# Patient Record
Sex: Male | Born: 1985 | Race: Black or African American | Hispanic: No | Marital: Married | Smoking: Current every day smoker
Health system: Southern US, Community
[De-identification: ages and names within clinical notes are randomized; demographics above are authoritative.]

## PROBLEM LIST (undated history)

## (undated) DIAGNOSIS — F319 Bipolar disorder, unspecified: Secondary | ICD-10-CM

## (undated) DIAGNOSIS — F209 Schizophrenia, unspecified: Secondary | ICD-10-CM

## (undated) DIAGNOSIS — J45909 Unspecified asthma, uncomplicated: Secondary | ICD-10-CM

## (undated) HISTORY — PX: HIP SURGERY: SHX245

---

## 2020-03-13 ENCOUNTER — Encounter (HOSPITAL_COMMUNITY): Payer: Self-pay

## 2020-03-13 ENCOUNTER — Other Ambulatory Visit: Payer: Self-pay

## 2020-03-13 DIAGNOSIS — Z20822 Contact with and (suspected) exposure to covid-19: Secondary | ICD-10-CM | POA: Insufficient documentation

## 2020-03-13 DIAGNOSIS — R42 Dizziness and giddiness: Secondary | ICD-10-CM | POA: Diagnosis not present

## 2020-03-13 DIAGNOSIS — R110 Nausea: Secondary | ICD-10-CM | POA: Diagnosis not present

## 2020-03-13 NOTE — ED Triage Notes (Signed)
Pt sts nausea and lightheaded x 2 days.

## 2020-03-14 ENCOUNTER — Emergency Department (HOSPITAL_COMMUNITY)
Admission: EM | Admit: 2020-03-14 | Discharge: 2020-03-14 | Disposition: A | Discharge status: Home / Self Care | Payer: Medicare (Managed Care) | Attending: Emergency Medicine | Admitting: Emergency Medicine

## 2020-03-14 DIAGNOSIS — R110 Nausea: Secondary | ICD-10-CM | POA: Diagnosis not present

## 2020-03-14 LAB — SARS CORONAVIRUS 2 (TAT 6-24 HRS): SARS Coronavirus 2: NEGATIVE

## 2020-03-14 MED ORDER — ONDANSETRON 4 MG PO TBDP: 4.0000 mg | ORAL_TABLET | Freq: Three times a day (TID) | ORAL | 0 refills | Status: AC | PRN

## 2020-03-14 MED ORDER — ONDANSETRON HCL 4 MG/2ML IJ SOLN
4.0000 mg | Freq: Once | INTRAMUSCULAR | Status: AC
  Administered 2020-03-14: 4 mg via INTRAVENOUS
  Filled 2020-03-14: qty 2

## 2020-03-14 NOTE — Discharge Instructions (Signed)
You were seen today for nausea and lightheadedness.  Given the chills, you were tested for COVID-19.  Check MyChart in 12 to 24 hours for results.  That testing is pending at this time.  Take Zofran as needed for nausea.  Make sure that you are staying hydrated.

## 2020-03-14 NOTE — ED Provider Notes (Signed)
[facility]-EMERGENCY DEPT Provider Note   CSN: [id number] Arrival date & time: 03/13/20  2116     History Chief Complaint  Patient presents with  . Nausea    Raymond Watkins is a 35 y.o. male.  HPI     This is a 35 year old male with no reported past medical history who presents with 1 to 2-day history of lightheadedness and nausea.  He states that generally he has felt unwell and has had chills.  He reports lightheadedness not described as room spinning.  He has associated nausea.  No vomiting or diarrhea.  No abdominal pain, chest pain, shortness of breath.  No known sick contacts or Covid exposures.  He did not take anything for his symptoms.  He states that he was at work prior to presenting to the ED.  Denies any recent changes in medicines.  Denies any alcohol or drug use.  History reviewed. No pertinent past medical history.  There are no problems to display for this patient.   History reviewed. No pertinent surgical history.     No family history on file.  Social History   Tobacco Use  . Smoking status: Never Smoker  . Smokeless tobacco: Never Used  Substance Use Topics  . Alcohol use: Never  . Drug use: Never    Home Medications Prior to Admission medications   Medication Sig Start Date End Date Taking? Authorizing Provider  ondansetron (ZOFRAN ODT) 4 MG disintegrating tablet Take 1 tablet (4 mg total) by mouth every 8 (eight) hours as needed for nausea or vomiting. 03/14/20  Yes Danyal Adorno, Mayer Masker, MD    Allergies    Patient has no known allergies.  Review of Systems   Review of Systems  Constitutional: Negative for fever.  Respiratory: Negative for shortness of breath.   Cardiovascular: Negative for chest pain.  Gastrointestinal: Positive for nausea. Negative for abdominal pain, diarrhea and vomiting.  Genitourinary: Negative for dysuria.  Neurological: Positive for light-headedness. Negative for dizziness, speech  difficulty, weakness and headaches.  All other systems reviewed and are negative.   Physical Exam Updated Vital Signs BP 124/86 (BP Location: Right Arm)   Pulse 73   Temp 98.4 F (36.9 C) (Oral)   Resp 14   Ht 1.956 m (6\' 5" )   Wt 79.4 kg   SpO2 97%   BMI 20.75 kg/m   Physical Exam Vitals and nursing note reviewed.  Constitutional:      Appearance: He is well-developed and well-nourished. He is not ill-appearing.  HENT:     Head: Normocephalic and atraumatic.     Nose: Nose normal.     Mouth/Throat:     Mouth: Mucous membranes are moist.  Eyes:     Pupils: Pupils are equal, round, and reactive to light.  Cardiovascular:     Rate and Rhythm: Normal rate and regular rhythm.     Heart sounds: Normal heart sounds. No murmur heard.   Pulmonary:     Effort: Pulmonary effort is normal. No respiratory distress.     Breath sounds: Normal breath sounds. No wheezing.  Abdominal:     General: Bowel sounds are normal.     Palpations: Abdomen is soft.     Tenderness: There is no abdominal tenderness. There is no rebound.  Musculoskeletal:        General: No edema.     Cervical back: Neck supple.     Right lower leg: No edema.     Left lower leg:  No edema.  Lymphadenopathy:     Cervical: No cervical adenopathy.  Skin:    General: Skin is warm and dry.  Neurological:     Mental Status: He is alert and oriented to person, place, and time.     Comments: Cranial nerves II through XII intact, 5 out of 5 strength in all 4 extremities, no dysmetria to finger-nose-finger  Psychiatric:        Mood and Affect: Mood and affect and mood normal.     ED Results / Procedures / Treatments   Labs (all labs ordered are listed, but only abnormal results are displayed) Labs Reviewed  SARS CORONAVIRUS 2 (TAT 6-24 HRS)    EKG EKG Interpretation  Date/Time:  Tuesday March 14 2020 02:48:37 EST Ventricular Rate:  62 PR Interval:    QRS Duration: 95 QT Interval:  403 QTC  Calculation: 410 R Axis:   54 Text Interpretation: Sinus arrhythmia Probable left ventricular hypertrophy Confirmed by Ross Marcus ([id number]) on 03/14/2020 3:25:49 AM   Radiology No results found.  Procedures Procedures   Medications Ordered in ED Medications  ondansetron (ZOFRAN) injection 4 mg (4 mg Intravenous Given 03/14/20 0301)    ED Course  I have reviewed the triage vital signs and the nursing notes.  Pertinent labs & imaging results that were available during my care of the patient were reviewed by me and considered in my medical decision making (see chart for details).    MDM Rules/Calculators/A&P                          Patient presents with nausea lightheadedness.  Reports chills.  He is overall nontoxic and vital signs are reassuring.  He is afebrile.  He has fairly nonspecific symptoms and is well-appearing on exam.  No chest pain, shortness of breath, abdominal pain.  Exam is benign.  Doubt intra-abdominal pathology, ACS, serious bacterial infection.  He is fully vaccinated against COVID-19.  Patient was given Zofran for his nausea.  Orthostatics are negative.  EKG shows sinus arrhythmia but no malignant arrhythmia or evidence of ischemia.  Patient was tested for COVID-19 given prevalence in the community and chills reported.  On recheck after Zofran, patient is tolerating fluids and states he feels better.  Given that he is otherwise healthy, do not feel he needs further work-up.  Recommended hydration and Zofran as needed for nausea.  After history, exam, and medical workup I feel the patient has been appropriately medically screened and is safe for discharge home. Pertinent diagnoses were discussed with the patient. Patient was given return precautions.  Raymond Watkins was evaluated in Emergency Department on 03/14/2020 for the symptoms described in the history of present illness. He was evaluated in the context of the global COVID-19 pandemic, which necessitated  consideration that the patient might be at risk for infection with the SARS-CoV-2 virus that causes COVID-19. Institutional protocols and algorithms that pertain to the evaluation of patients at risk for COVID-19 are in a state of rapid change based on information released by regulatory bodies including the CDC and federal and state organizations. These policies and algorithms were followed during the patient's care in the ED.  Final Clinical Impression(s) / ED Diagnoses Final diagnoses:  Nausea    Rx / DC Orders ED Discharge Orders         Ordered    ondansetron (ZOFRAN ODT) 4 MG disintegrating tablet  Every 8 hours PRN  03/14/20 0406           Shon Baton, MD 03/14/20 [phone]

## 2020-05-13 ENCOUNTER — Encounter (HOSPITAL_BASED_OUTPATIENT_CLINIC_OR_DEPARTMENT_OTHER): Payer: Self-pay | Admitting: *Deleted

## 2020-05-13 ENCOUNTER — Other Ambulatory Visit: Payer: Self-pay

## 2020-05-13 ENCOUNTER — Emergency Department (HOSPITAL_BASED_OUTPATIENT_CLINIC_OR_DEPARTMENT_OTHER)
Admission: EM | Admit: 2020-05-13 | Discharge: 2020-05-13 | Disposition: A | Discharge status: Home / Self Care | Payer: Medicare (Managed Care) | Attending: Emergency Medicine | Admitting: Emergency Medicine

## 2020-05-13 ENCOUNTER — Emergency Department (HOSPITAL_BASED_OUTPATIENT_CLINIC_OR_DEPARTMENT_OTHER): Payer: Medicare (Managed Care)

## 2020-05-13 DIAGNOSIS — J45909 Unspecified asthma, uncomplicated: Secondary | ICD-10-CM | POA: Insufficient documentation

## 2020-05-13 DIAGNOSIS — M25511 Pain in right shoulder: Secondary | ICD-10-CM | POA: Insufficient documentation

## 2020-05-13 DIAGNOSIS — R0781 Pleurodynia: Secondary | ICD-10-CM | POA: Insufficient documentation

## 2020-05-13 DIAGNOSIS — F17210 Nicotine dependence, cigarettes, uncomplicated: Secondary | ICD-10-CM | POA: Insufficient documentation

## 2020-05-13 HISTORY — DX: Unspecified asthma, uncomplicated: J45.909

## 2020-05-13 LAB — CBC WITH DIFFERENTIAL/PLATELET
Abs Immature Granulocytes: 0.04 10*3/uL (ref 0.00–0.07)
Basophils Absolute: 0 10*3/uL (ref 0.0–0.1)
Basophils Relative: 1 %
Eosinophils Absolute: 0 10*3/uL (ref 0.0–0.5)
Eosinophils Relative: 1 %
HCT: 40.1 % (ref 39.0–52.0)
Hemoglobin: 13.5 g/dL (ref 13.0–17.0)
Immature Granulocytes: 1 %
Lymphocytes Relative: 32 %
Lymphs Abs: 2.5 10*3/uL (ref 0.7–4.0)
MCH: 29.4 pg (ref 26.0–34.0)
MCHC: 33.7 g/dL (ref 30.0–36.0)
MCV: 87.4 fL (ref 80.0–100.0)
Monocytes Absolute: 0.4 10*3/uL (ref 0.1–1.0)
Monocytes Relative: 5 %
Neutro Abs: 4.8 10*3/uL (ref 1.7–7.7)
Neutrophils Relative %: 60 %
Platelets: 206 10*3/uL (ref 150–400)
RBC: 4.59 MIL/uL (ref 4.22–5.81)
RDW: 12.7 % (ref 11.5–15.5)
WBC: 7.8 10*3/uL (ref 4.0–10.5)
nRBC: 0 % (ref 0.0–0.2)

## 2020-05-13 LAB — URINALYSIS, ROUTINE W REFLEX MICROSCOPIC
Bilirubin Urine: NEGATIVE
Glucose, UA: NEGATIVE mg/dL
Hgb urine dipstick: NEGATIVE
Ketones, ur: NEGATIVE mg/dL
Nitrite: NEGATIVE
Protein, ur: NEGATIVE mg/dL
Specific Gravity, Urine: 1.025 (ref 1.005–1.030)
pH: 5.5 (ref 5.0–8.0)

## 2020-05-13 LAB — BASIC METABOLIC PANEL
Anion gap: 11 (ref 5–15)
BUN: 16 mg/dL (ref 6–20)
CO2: 24 mmol/L (ref 22–32)
Calcium: 8.6 mg/dL — ABNORMAL LOW (ref 8.9–10.3)
Chloride: 104 mmol/L (ref 98–111)
Creatinine, Ser: 1.04 mg/dL (ref 0.61–1.24)
GFR, Estimated: >60 mL/min (ref >60)
Glucose, Bld: 86 mg/dL (ref 70–99)
Potassium: 3.7 mmol/L (ref 3.5–5.1)
Sodium: 139 mmol/L (ref 135–145)

## 2020-05-13 LAB — URINALYSIS, MICROSCOPIC (REFLEX)

## 2020-05-13 MED ORDER — IBUPROFEN 800 MG PO TABS
800.0000 mg | ORAL_TABLET | Freq: Once | ORAL | Status: AC
  Administered 2020-05-13: 800 mg via ORAL
  Filled 2020-05-13 (×2): qty 1

## 2020-05-13 NOTE — ED Triage Notes (Signed)
Pt reports shooting pain to right ribs wrapping around to back. States he woke with the pain. Denies falls/injury

## 2020-05-13 NOTE — Discharge Instructions (Signed)
You were seen in the ER today for your right-sided rib pain and shoulder pain.  Your physical exam, x-rays, and blood work were very reassuring.  While the exact cause of your symptoms remains unclear there is not any emergent problem at this time.  You are likely experiencing inflammation of the muscles and joint of your shoulder because of sleeping with your arm raised above your head.  Please utilize ice to the area of the ribs that is more sore, and take medications as Tylenol or ibuprofen as needed at home for pain return to the ER if you develop any worsening chest pain, fevers, chills, cough, shortness of breath, palpitations or any other new severe symptoms.

## 2020-05-13 NOTE — ED Provider Notes (Signed)
[facility] EMERGENCY DEPARTMENT Provider Note   CSN: [id number] Arrival date & time: 05/13/20  1323     History Chief Complaint  Patient presents with  . Flank Pain  . Shoulder Pain    Raymond Watkins is a 35 y.o. male who presents with right-sided rib pain and shoulder pain that he woke up with this morning.  Patient states he sleeps with his arm over his head behind his head much of the time, denies any recent trauma or new activities.  He states that he was playing with his children yesterday evening and feeling well when he went to bed but woke up this morning with tenderness palpation over the back right ribs and in his shoulder as well.  He denies any fevers or chills at home, denies any chest pain, cough, shortness of breath, or palpitations.  He does not have sickle cell disease.  Direct pressure and movement make the pain worse, but the pain is relieved with Goody powders or Tylenol.  I personally reviewed this patient's medical records.  He has history of asthma, hip surgery, and remote history of polysubstance abuse.  He is not on any medications every day.  HPI     Past Medical History:  Diagnosis Date  . Asthma     There are no problems to display for this patient.   Past Surgical History:  Procedure Laterality Date  . HIP SURGERY         No family history on file.  Social History   Tobacco Use  . Smoking status: Current Every Day Smoker    Types: Cigarettes  . Smokeless tobacco: Never Used  Substance Use Topics  . Alcohol use: Yes  . Drug use: Never    Home Medications Prior to Admission medications   Medication Sig Start Date End Date Taking? Authorizing Provider  ondansetron (ZOFRAN ODT) 4 MG disintegrating tablet Take 1 tablet (4 mg total) by mouth every 8 (eight) hours as needed for nausea or vomiting. 03/14/20   Horton, Mayer Masker, MD    Allergies    Patient has no known allergies.  Review of Systems   Review of Systems   Constitutional: Negative for activity change, appetite change, chills, diaphoresis, fatigue and fever.  HENT: Negative.   Respiratory: Negative.  Negative for cough, shortness of breath and wheezing.   Cardiovascular: Negative for chest pain, palpitations and leg swelling.  Gastrointestinal: Negative.   Musculoskeletal: Positive for arthralgias and myalgias.       Posterior right rib pain, right shoulder pain    Physical Exam Updated Vital Signs BP (!) 229/76 (BP Location: Left Arm)   Pulse 86   Temp 98.3 F (36.8 C) (Oral)   Resp 18   Ht 6\' 5"  (1.956 m)   Wt 81.6 kg   SpO2 99%   BMI 21.34 kg/m   Physical Exam Vitals and nursing note reviewed.  HENT:     Head: Normocephalic and atraumatic.     Nose: Nose normal.     Mouth/Throat:     Mouth: Mucous membranes are moist.     Pharynx: Oropharynx is clear. Uvula midline. No oropharyngeal exudate or posterior oropharyngeal erythema.  Eyes:     General: Lids are normal. Vision grossly intact.        Right eye: No discharge.        Left eye: No discharge.     Extraocular Movements: Extraocular movements intact.     Conjunctiva/sclera: Conjunctivae normal.  Pupils: Pupils are equal, round, and reactive to light.  Neck:     Trachea: Trachea and phonation normal.  Cardiovascular:     Rate and Rhythm: Normal rate and regular rhythm.     Pulses: Normal pulses.     Heart sounds: Normal heart sounds. No murmur heard.   Pulmonary:     Effort: Pulmonary effort is normal. No tachypnea, bradypnea, accessory muscle usage, prolonged expiration or respiratory distress.     Breath sounds: Normal breath sounds. No wheezing or rales.    Chest:     Chest wall: Tenderness present. No mass, lacerations, deformity, swelling, crepitus or edema. There is no dullness to percussion.     Comments: No anterior chest wall tenderness to palpation, crepitus, or deformity.  Abdominal:     General: Bowel sounds are normal. There is no distension.      Tenderness: There is no abdominal tenderness. There is no right CVA tenderness, left CVA tenderness, guarding or rebound.  Musculoskeletal:        General: No deformity.     Right shoulder: Tenderness and bony tenderness present. No swelling, deformity, effusion, laceration or crepitus. Normal range of motion. Normal strength.     Left shoulder: Normal.     Right upper arm: Normal.     Left upper arm: Normal.     Right elbow: Normal.     Left elbow: Normal.     Right forearm: Normal.     Left forearm: Normal.     Right wrist: Normal.     Left wrist: Normal.     Right hand: Normal.     Left hand: Normal.       Arms:     Cervical back: Neck supple. No crepitus. No pain with movement, spinous process tenderness or muscular tenderness.     Right lower leg: No edema.     Left lower leg: No edema.  Lymphadenopathy:     Cervical: No cervical adenopathy.  Skin:    General: Skin is warm and dry.  Neurological:     General: No focal deficit present.     Mental Status: He is alert. Mental status is at baseline.  Psychiatric:        Mood and Affect: Mood normal.     ED Results / Procedures / Treatments   Labs (all labs ordered are listed, but only abnormal results are displayed) Labs Reviewed  BASIC METABOLIC PANEL - Abnormal; Notable for the following components:      Result Value   Calcium 8.6 (*)    All other components within normal limits  URINALYSIS, ROUTINE W REFLEX MICROSCOPIC - Abnormal; Notable for the following components:   Leukocytes,Ua SMALL (*)    All other components within normal limits  URINALYSIS, MICROSCOPIC (REFLEX) - Abnormal; Notable for the following components:   Bacteria, UA FEW (*)    All other components within normal limits  CBC WITH DIFFERENTIAL/PLATELET    EKG None  Radiology DG Ribs Unilateral W/Chest Right  Result Date: 05/13/2020 CLINICAL DATA:  Right rib pain. EXAM: RIGHT RIBS AND CHEST - 3+ VIEW COMPARISON:  Right shoulder  radiographs-earlier same day FINDINGS: Normal cardiac silhouette and mediastinal contours. No focal parenchymal opacities. No pleural effusion or pneumothorax. No evidence of edema. No definite displaced right-sided rib fractures with special attention paid to the area demarcated by the radiopaque BB. No radiopaque foreign body. IMPRESSION: 1. No acute cardiopulmonary disease. 2. No definite displaced right-sided rib fractures with special attention paid  to the area demarcated by the radiopaque BB. Electronically Signed   By: Simonne Come M.D.   On: 05/13/2020 15:13   DG Shoulder Right  Result Date: 05/13/2020 CLINICAL DATA:  Shooting pain involving the right ribs and shoulder. Evaluate for fracture EXAM: RIGHT SHOULDER - 2+ VIEW COMPARISON:  Right rib radiographic series-earlier same day FINDINGS: No fracture or dislocation. Joint spaces are preserved. No evidence of calcific tendinitis. Limited visualization of the adjacent thorax is normal. Regional soft tissues appear normal. IMPRESSION: No explanation for patient's right shoulder pain. Electronically Signed   By: Simonne Come M.D.   On: 05/13/2020 15:12    Procedures Procedures   Medications Ordered in ED Medications  ibuprofen (ADVIL) tablet 800 mg (800 mg Oral Given 05/13/20 1509)    ED Course  I have reviewed the triage vital signs and the nursing notes.  Pertinent labs & imaging results that were available during my care of the patient were reviewed by me and considered in my medical decision making (see chart for details).    MDM Rules/Calculators/A&P                          35 year old male presents with 1 day of right-sided posterior rib pain and right shoulder pain that he woke up with this morning.  Differential diagnosis for this patient symptoms includes but is not limited to costochondritis, musculoskeletal injury, pneumonia, pleural effusion, PE, rib fracture. Acute chest syndrome not on differential as this patient does not  have sickle cell disease.  Vital signs are normal on intake.  Cardiopulmonary; abdominal exam is benign.  Patient without any chest wall tenderness to palpation, there is tenderness palpation of the right shoulder without erythema, edema, crepitus, or deformity.  Additionally there is tenderness palpation of the right posterior inferior ribs without crepitus, edema, or erythema.  CBC is unremarkable, CMP is unremarkable, UA is normal, Plain film of the right ribs was negative for acute cardiopulmonary disease and negative for acute right-sided rib fracture.  Plain film the shoulder was negative for acute fracture dislocation.  Given reassuring physical exam, vital signs, and work-up, no further laboratory studies, no further was warranted in the ED at this time.  While exact etiology of this patient symptoms remains unclear, there is not appear to be any emergent cause at this time.  Suspect patient symptoms are secondary to acute musculoskeletal injury, likely from awkward sleeping position.  Recommend OTC analgesia such as ibuprofen or Tylenol as well as application of ice to the area.  Recommend follow-up with his primary care doctor.  Markice voiced understanding of his medical evaluation and treatment plan.  Each of his questions was answered to his expressed satisfaction.  Return precautions given.  Patient is well-appearing, stable, and appropriate for discharge at this time.   Final Clinical Impression(s) / ED Diagnoses Final diagnoses:  Rib pain    Rx / DC Orders ED Discharge Orders    None       Sherrilee Gilles 05/14/20 0016    Alvira Monday, MD 05/15/20 1208

## 2020-06-20 ENCOUNTER — Other Ambulatory Visit: Payer: Self-pay

## 2020-06-20 ENCOUNTER — Encounter (HOSPITAL_BASED_OUTPATIENT_CLINIC_OR_DEPARTMENT_OTHER): Payer: Self-pay

## 2020-06-20 ENCOUNTER — Emergency Department (HOSPITAL_BASED_OUTPATIENT_CLINIC_OR_DEPARTMENT_OTHER)
Admission: EM | Admit: 2020-06-20 | Discharge: 2020-06-20 | Disposition: A | Discharge status: Home / Self Care | Payer: Medicaid - Out of State | Attending: Emergency Medicine | Admitting: Emergency Medicine

## 2020-06-20 DIAGNOSIS — S069X9A Unspecified intracranial injury with loss of consciousness of unspecified duration, initial encounter: Secondary | ICD-10-CM | POA: Insufficient documentation

## 2020-06-20 DIAGNOSIS — Z5321 Procedure and treatment not carried out due to patient leaving prior to being seen by health care provider: Secondary | ICD-10-CM | POA: Diagnosis not present

## 2020-06-20 HISTORY — DX: Bipolar disorder, unspecified: F31.9

## 2020-06-20 HISTORY — DX: Schizophrenia, unspecified: F20.9

## 2020-06-20 NOTE — ED Notes (Signed)
Pt again left the department, did not want to stay to be seen by provider
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
## 2020-06-20 NOTE — ED Notes (Signed)
Pt left department and told police officer that he was leaving.

## 2020-06-20 NOTE — ED Provider Notes (Cosign Needed)
Pt eloped from Emergency Department before I was able to evaluate him.    Mare Ferrari, PA-C 06/20/20 1408

## 2022-08-24 IMAGING — CR DG SHOULDER 2+V*R*
3 series · 3 of 3 positions shown · non-contrast
Comparison: Right rib radiographic series-earlier same day

CLINICAL DATA: Shooting pain involving the right ribs and shoulder.
Evaluate for fracture

EXAM:
RIGHT SHOULDER - 2+ VIEW

[w shoulder grashey right]
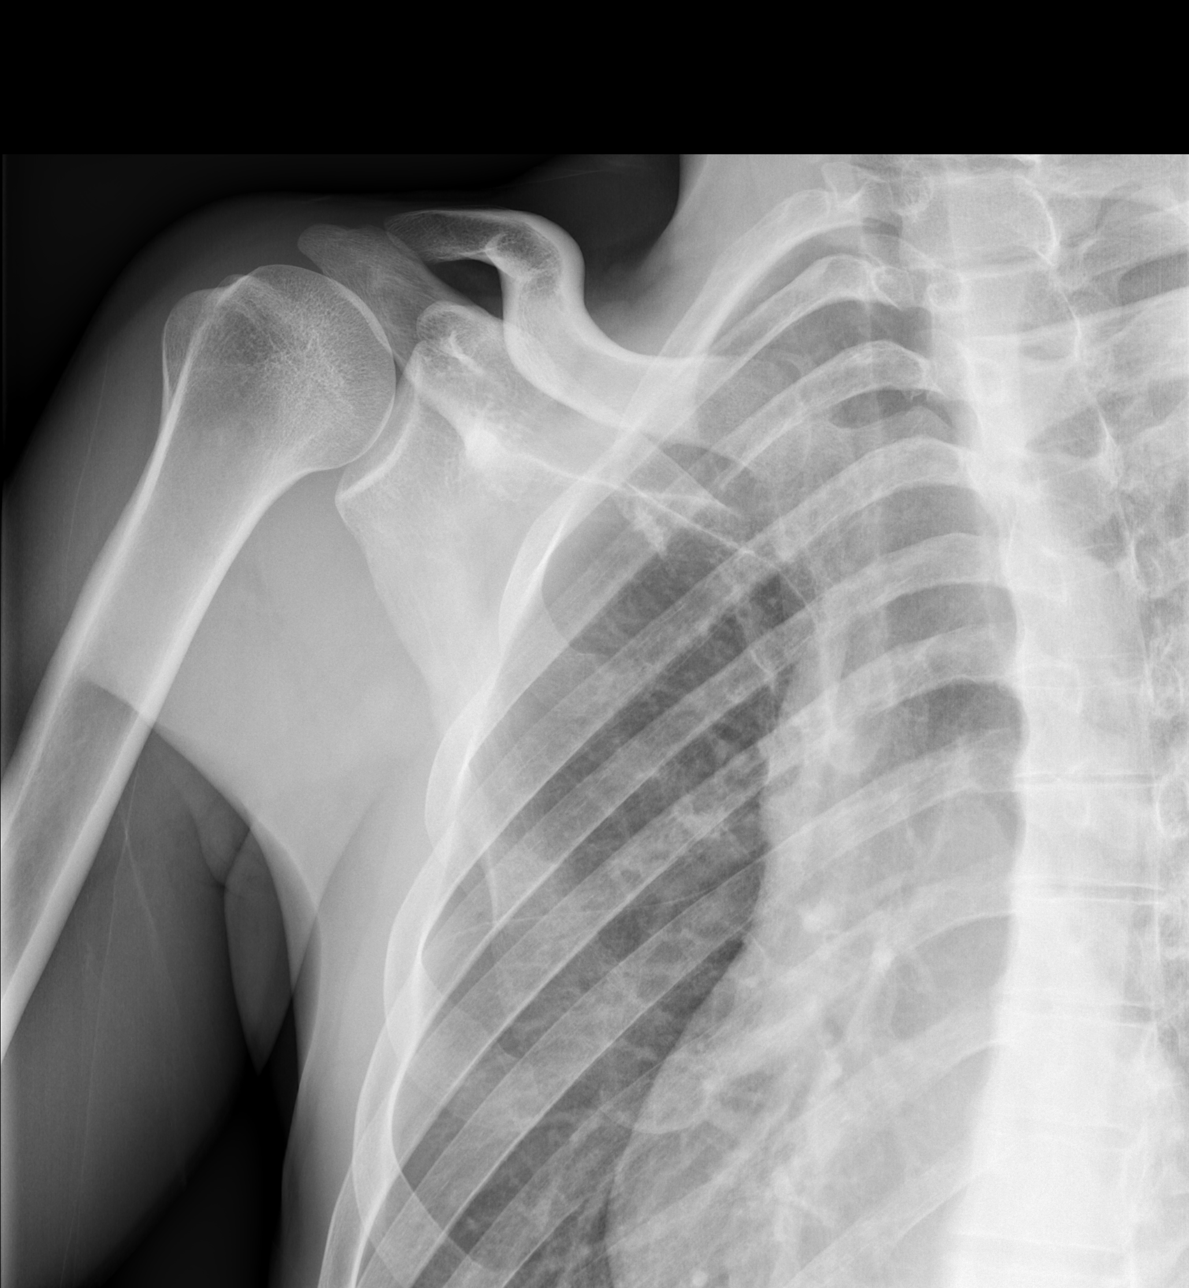

[w shoulder y view right]
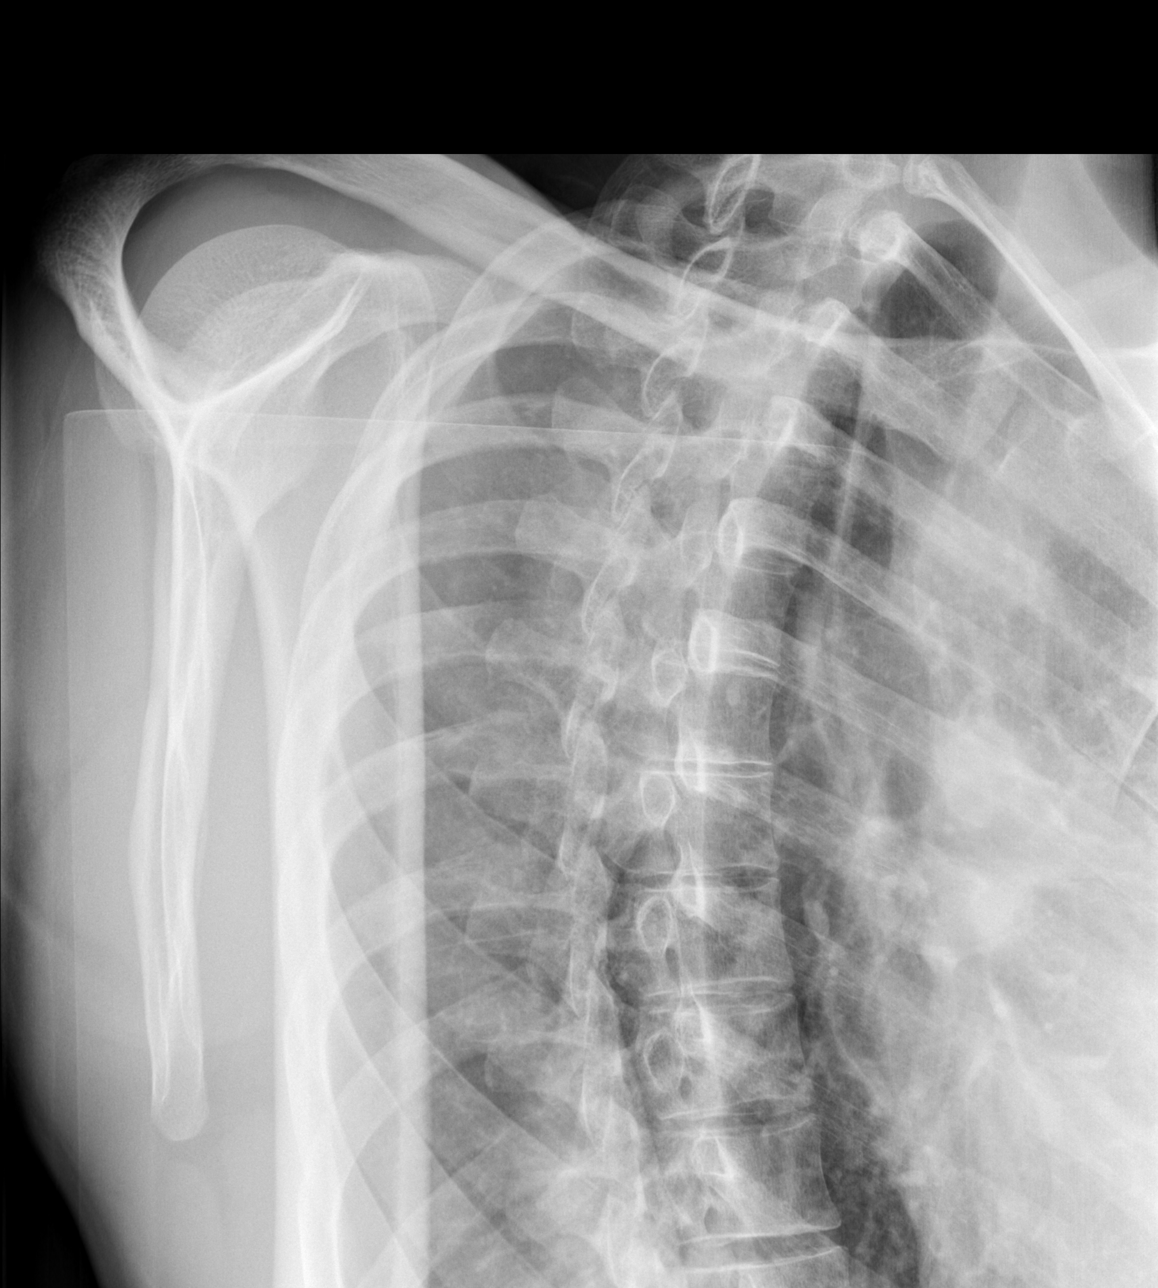

[x shoulder axillary right *]
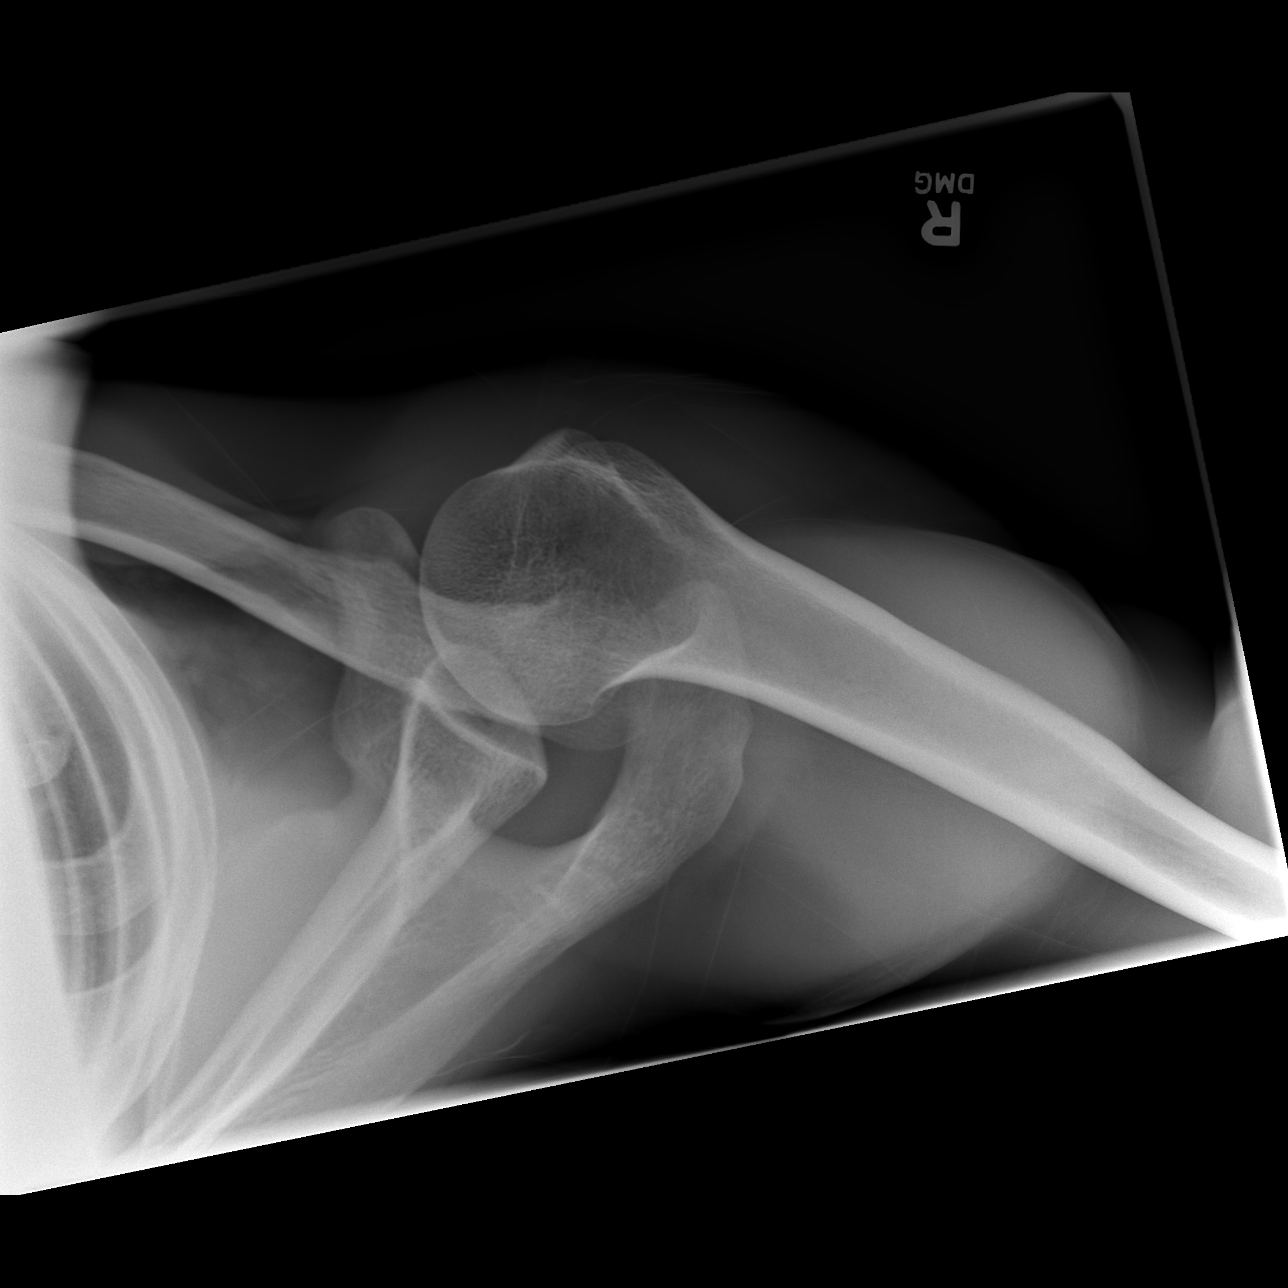

[3 of 3 positions shown; findings below may reference images not displayed]

FINDINGS: No fracture or dislocation. Joint spaces are preserved. No evidence
of calcific tendinitis. Limited visualization of the adjacent thorax
is normal. Regional soft tissues appear normal.
IMPRESSION: No explanation for patient's right shoulder pain.
